# Patient Record
Sex: Male | Born: 2005 | Race: White | Hispanic: No | Marital: Single | Smoking: Never smoker
Health system: Southern US, Community
[De-identification: ages and names within clinical notes are randomized; demographics above are authoritative.]

## PROBLEM LIST (undated history)

## (undated) DIAGNOSIS — L309 Dermatitis, unspecified: Secondary | ICD-10-CM

## (undated) DIAGNOSIS — K219 Gastro-esophageal reflux disease without esophagitis: Secondary | ICD-10-CM

## (undated) DIAGNOSIS — K5010 Crohn's disease of large intestine without complications: Secondary | ICD-10-CM

## (undated) DIAGNOSIS — E559 Vitamin D deficiency, unspecified: Secondary | ICD-10-CM

## (undated) DIAGNOSIS — E46 Unspecified protein-calorie malnutrition: Secondary | ICD-10-CM

## (undated) DIAGNOSIS — D649 Anemia, unspecified: Secondary | ICD-10-CM

## (undated) HISTORY — DX: Anemia, unspecified: D64.9

## (undated) HISTORY — DX: Dermatitis, unspecified: L30.9

## (undated) HISTORY — DX: Gastro-esophageal reflux disease without esophagitis: K21.9

## (undated) HISTORY — PX: WISDOM TOOTH EXTRACTION: SHX21

## (undated) HISTORY — PX: BOWEL RESECTION: SHX1257

## (undated) HISTORY — DX: Vitamin D deficiency, unspecified: E55.9

## (undated) HISTORY — DX: Unspecified protein-calorie malnutrition: E46

---
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
## 2023-05-28 ENCOUNTER — Telehealth: Payer: Self-pay | Admitting: Pediatrics

## 2023-05-28 NOTE — Telephone Encounter (Signed)
Inbound call fro patient father in regards to labs patient had done previously. Patient is schedule for apt on 3/14 with provider. Requesting to speak with a nurse in regards to patient. Please advise.

## 2023-05-28 NOTE — Telephone Encounter (Signed)
Left voicemail on patients mobile for patient to call back. Patient's father called. Unfortunately, we do not have an ROI on file for this patient and he is now 18 years old. Therefore, I will need to speak with only him until we get a signed ROI giving clearance to speak with another family member.

## 2023-06-01 NOTE — Telephone Encounter (Signed)
Talked to patient. He is unaware of any phone calls that his father would have made to our office. States that he is supposed to have labwork and orders are in the computer but he will not be able to go until Tuesday. Advised patient that he may call us back if he speaks with his father and finds out something is needed from [place]. However, I can only speak to patient currently until we get a written ROI to speak with anyone else. He verbalizes understanding.

## 2023-07-23 NOTE — Progress Notes (Unsigned)
 [facility] Return Visit   Referring Provider No referring provider defined for this encounter.  Primary Care Provider Pudlo, Gennie Alma, MD (Inactive)  Patient Profile: Hector Dominguez is a 18 y.o. male who returns to the [facility] for follow-up of the problem(s) noted below.  Problem List: Stricturing ileocolonic Crohn's disease diagnosed 10/2017 status post ileocecectomy 12/2021 complicated by abdominal wall abscess, intra-abdominal fluid collections, bowel obstructions with diverting loop ileostomy 05/2022 Weight loss Severe protein calorie malnutrition with previous need for TPN Anemia Vitamin D deficiency   History of Present Illness   Hector Dominguez was last seen by me at [facility] 03/10/2023   Current GI Meds  Rinvoq 30 mg po daily - Induction 12/2022 Colestid 2 g p.o. twice daily   Interval History    Last colonoscopy: 11/2021 -colon appeared mostly normal. At this ulceration seen. Narrowing noted at the ascending colon and unable to traverse.  Last endoscopy: 11/2021 - normal   Last Abd CT/CTE/MRE: 12/2022 -CTAP -partial SBO with transition point in the left mid abdomen had a small bowel-small bowel anastomosis.  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 Otherwise negative.  General Review of Systems  Review of systems is significant for the pertinent positives and negatives as listed per the HPI.  Full ROS is otherwise negative.  Inflammatory Bowel Disease History  10/2017 - abdominal pain, diarrhea, weight loss; EGD/Colonoscopy -gastroduodenal and ileocolonic Crohn's disease --> treated with prednisone taper and Humira 40 mg Q 14 days with response 03/2018 - prednisone taper and Humira increased to q. 7 days for flare symptoms, ADA 14 with low level antibodies --> oral MTX added 08/2020 - active sx; EGD/Colonoscopy -pancolitis and  ileitis 11/2020 - Induction Stelara with symptomatic response 06/2021 - Stelara increased from every 8 weeks to every 6 weeks for CRP 60, fecal calprotectin 732 and required prednisone taper 11/2021 - EGD/Colonoscopy - stricture in ileocecal region/ascending colon but otherwise relatively normal-appearing colon 12/2021 - laparoscopic ileocecectomy with side-to-side functional end-to-end anastomosis c/b abdominal wall and intra-abdominal fluid collections Fall 2023 - ~ 5 hospitalizations including abdominal exploration/washout 02/25/2022 and 04/30/2022; E faecalis bacteremia 04/15/2022 on TPN with central line 05/2022 - Ex lap for high-grade bowel obstruction secondary to adhesions and diverting ileostomy 11/2022 - Ileostomy takedown and went back to the OR same hospitalization for resection of distal obstruction at site of previous anastomosis 01/2023 - On steroid taper for abd pain (pain responds to steroids); Stelara D/C'd for lack of response and started Rinvoq   IBD Medication History Humira - partial clinical response with report of low level of immunogenicity/antibodies Methotrexate - added to Humira for reduction of immunogenicity Stelara - trialed status post Humira for lack of response -initially demonstrated a response but subsequently began having active symptoms and inflammation despite surgery Rinvoq - initiated 01/2023 for lack of response to Stelara  Past Medical History   Past Medical History:  Diagnosis Date   Crohn's colitis [facility])      Past Surgical History  No past surgical history on file.   Allergies and Medications  Not on File  @MEDSTODAY @  Family History  No family history on file. GI Specific Family History: {gifamhx:50061}   Social History   Social History   Tobacco Use   Smoking status: Never   Hector Dominguez reports that he has never smoked. He does not have any smokeless tobacco history on file. No history on file for alcohol use  and drug use.  Vital  Signs and Physical Examination  There were no vitals filed for this visit. There is no height or weight on file to calculate BMI.    General: Well developed, well nourished, no acute distress Head: Normocephalic and atraumatic Eyes: Sclerae anicteric, EOMI Ears: Normal auditory acuity Mouth: No deformities or lesions noted Lungs: Clear throughout to auscultation Heart: Regular rate and rhythm; No murmurs, rubs or bruits Abdomen: Soft, non tender and non distended. No masses, hepatosplenomegaly or hernias noted. Normal Bowel sounds Rectal: Musculoskeletal: Symmetrical with no gross deformities  Pulses:  Normal pulses noted Extremities: No edema or deformities noted Neurological: Alert oriented x 4, grossly nonfocal Psychological:  Alert and cooperative. Normal mood and affect   Review of Data  The following data was reviewed at the time of this encounter:  Laboratory Studies      Latest Ref Rng & Units 05/19/2014   10:30 AM  CBC  WBC 4.5 - 13.5 K/uL 7.0   Hemoglobin 11.0 - 14.6 g/dL 01.0   Hematocrit 27.2 - 44.0 % 38.2   Platelets 150 - 400 K/uL 216     Lab Results  Component Value Date   LIPASE 26 05/19/2014      Latest Ref Rng & Units 05/19/2014   10:30 AM  CMP  Glucose 70 - 99 mg/dL 536   BUN 6 - 23 mg/dL 8   Creatinine 6.44 - 0.34 mg/dL 7.42   Sodium 595 - 638 mmol/L 137   Potassium 3.5 - 5.1 mmol/L 3.8   Chloride 96 - 112 mEq/L 103   CO2 19 - 32 mmol/L 28   Calcium 8.4 - 10.5 mg/dL 9.1   Total Protein 6.0 - 8.3 g/dL 6.4   Total Bilirubin 0.3 - 1.2 mg/dL 0.5   Alkaline Phos 42 - 362 U/L 170   AST 0 - 37 U/L 23   ALT 0 - 53 U/L 12    IBD Labs  Prebiologic Labs @LASTLAB [facility]  Therapeutic Drug Monitoring @LASTLAB (TPMTACT)@ Thiopurine metabolite levels:  Date:                6-TGN***       6-MMP***  Biologic level and antibodies:***  Fecal Calprotectin 12/2022     587 05/2023   2320 07/2023    1660  Imaging Studies  CTAP 12/22/2022 1. Partial small bowel obstruction with transition point in the left mid abdomen at a small bowel-small bowel anastomosis. No pneumatosis or pneumoperitoneum. 2. 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 Correlate with direct inspection.   CTAP 11/21/22 1. Diffusely dilated small bowel. It is unclear if this represents severe postoperative ileus versus obstruction. If obstruction, candidate transition point may be at the site of post surgical change in the right lower quadrant versus a transition in left upper quadrant, as described above. Recommend attention on follow-up imaging. 2. Moderate volume free air and fluid may be postoperative in etiology. This may also be reevaluated on follow-up imaging.  CTAP 06/24/2022 Postsurgical changes of ileocecectomy. Interval decrease in previously seen inflammatory changes around the proximal colon. Interval creation of right lower quadrant end ileostomy. Left upper quadrant small bowel anastomosis. Left upper quadrant small bowel anastomosis. Mild hyperenhancement of a short segment of distal ileum at the ileostomy which could reflect active inflammation. Interval resolution of previously seen left lower quadrant intra-abdominal abscess. No new abscess is seen.  Soft tissue thickening extending from the left lateral margin of the anus to the  perineal skin (for example series 4 image 160. This was partially outside the imaged area on 03/26/2022 and could represent a perianal fistula of indeterminate patency. This would be better characterized by MRI.  MRE 05/13/2022 1. Post-surgical changes of ileoceectomy with similar inflammatory changes about the neoterminal ileum, ascending colon, and hepatic flexure, given differences in modality. 2. Increased distention of small bowel and fecalization of contents, with 2 transition points within the right abdomen adjacent  to the inflammatory changes within the right lower quadrant. However, there is also moderate volume stool in the distal large bowel, suggesting early or partial obstruction. 3. New small rim-enhancing collection anterior to the right iliopsoas, likely abscess. 4. Stable left midline intraperitoneal abscess with close abutment to adjacent bowel. Fistulous connection is not well evaluated on this exam.   CTAP 04/14/2022 1. Postsurgical changes of ileocecectomy with similar to minimally improved inflammatory changes about the neoterminal ileum, ascending colon, hepatic flexure. 2. Increased circumferential bowel wall thickening of the descending colon, sigmoid colon, and rectum that may reflect a nonspecific colitis.  3. Continued slight decrease in the anterior left-midline intraperitoneal abscess that now has a small focus of air, but without internal contrast. There is still close abutment to adjacent small bowel without frank fistulous connection visualized on the current study. 4. Unchanged partially visualized asymmetric left gluteal thickening possibly reflecting sequelae from prior perianal fistula.  CTE 03/26/2022 1. Postsurgical changes of ileocecectomy similar inflammatory changes within the right lower quadrant. 2. Decreased size of left anterior intraperitoneal abscess, with persistent possible extension to the bowel in the right lower quadrant.   CTAP 03/12/2022 1. Post surgical changes of ileocecectomy. Similar inflammatory changes within the neoterminal ileum, ascending colon, and hepatic flexure. 2. Intraperitoneal abscess which drains through the umbilicus with possible tract/communication with the neoileum in the left lower quadrant, though no evidence of oral contrast within this tract/abscess. 3. Similar asymmetric soft tissue thickening of the left gluteal cleft and tiny left rectal wall fluid collection, possibly perianal fistula.   GI Procedures and Studies  EGD/Colonoscopy  11/2021 EGD - normal Colonoscopy -mostly normal colon with some scattered aphthae, narrowing at the ascending colon, unable to traverse Path: Duodenal mucosa with villous blunting and increased intraepithelial lymphocytes, chronic inactive colitis in the ascending colon, sigmoid colon; normal mucosa in transverse colon and rectum  EGD/Colonoscopy 08/2020 EGD -mild inflammatory changes seen in the stomach and duodenum Colonoscopy -anal fissure, diffuse severe inflammation throughout the entire colon, scattered inflammatory changes in the terminal ileum Path: Duodenal mucosa with villous blunting and increased intraepithelial lymphocytes, no active enteritis, moderate chronic and active gastritis, focal active ileitis, mild chronic active colitis throughout the entire colon  EGD/Colonoscopy 10/2017 EGD -ulcerated mucosa in the esophagus and prepyloric stomach Colonoscopy -diffuse moderate inflammation from the rectum to the ileocecal valve Path: Mixed acute and chronic inflammation in the esophagus, chronic active gastritis, chronic active duodenitis, TI biopsies with focal crypt architectural distortion, active colitis in the rectum, left colon and right colon; transverse colon biopsies normal    Clinical Impression  It is my clinical impression that Hector Dominguez is a 18 y.o. male with;  Stricturing ileocolonic Crohn's disease diagnosed 10/2017 status post ileocecectomy 12/2021 complicated by abdominal wall abscess, intra-abdominal fluid collections, bowel obstructions with diverting loop ileostomy 05/2022 Weight loss Severe protein calorie malnutrition with previous need for TPN Anemia Vitamin D deficiency  Hector Dominguez was diagnosed with Crohn's disease 10/2017 after presenting with symptoms of abdominal pain, diarrhea and weight loss. His distribution of  disease has included the stomach, duodenum, ileum and colon. At the time of diagnosis his phenotype was strictly inflammatory and later progressed  to stricturing disease.  From a medical management standpoint, he was initially treated with prednisone taper and Humira optimized from q. 14-day dosing to Q7-day dosing due to flare activity and low level antibodies. Methotrexate was also added to his regimen. Unfortunately this did not control his disease and he was therefore transitioned to [facility] 11/2020. He reported a symptomatic response but continued to manifest elevated CRP and fecal calprotectin. Stelara was further dose optimized to every 4 weeks. Colonoscopy in summer 2023 demonstrated a relatively normal-appearing colon with a few scattered apathae but evidence of a stricture in the ascending colon/ileocecal region.   Hector Dominguez underwent laparoscopic ileocecectomy 12/2021 complicated by abdominal wall abscess, intra-abdominal fluid collections, bowel obstructions with diverting loop ileostomy 05/2022 for which he has had multiple hospitalizations, re-operation and required TPN. In 11/2022 he underwent ileostomy takedown. During that same hospitalization he required reoperation for distal obstruction at site of previous anastomosis.   Hector Dominguez was rehospitalized 12/2022 for abdominal pain. CTAP showed partial small bowel obstruction with a transition point in a small bowel-small bowel anastomosis. 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 His wound has been managed by pediatric surgery. Due to ongoing symptoms of pain and inflammation he was initiated on a steroid taper to which he responded. In 01/2023 Stelara was discontinued and he was started on Rinvoq 45 mg po daily.    Plan  *** *** *** *** ***   Planned Follow Up No follow-ups on file.  The patient or caregiver verbalized understanding of the material covered, with no barriers to understanding. All questions were answered. Patient or caregiver is agreeable with the plan outlined above.    It was a pleasure to see Hector Dominguez.  If you have any questions or concerns regarding this  evaluation, do not hesitate to contact me.  Maren Beach, MD [facility]

## 2023-07-24 ENCOUNTER — Ambulatory Visit: Payer: Managed Care, Other (non HMO) | Admitting: Pediatrics

## 2023-07-24 ENCOUNTER — Encounter: Payer: Self-pay | Admitting: Pediatrics

## 2023-07-24 ENCOUNTER — Other Ambulatory Visit

## 2023-07-24 VITALS — BP 90/60 | HR 85 | Ht 69.0 in | Wt 132.0 lb

## 2023-07-24 DIAGNOSIS — D649 Anemia, unspecified: Secondary | ICD-10-CM

## 2023-07-24 DIAGNOSIS — K50814 Crohn's disease of both small and large intestine with abscess: Secondary | ICD-10-CM

## 2023-07-24 DIAGNOSIS — R634 Abnormal weight loss: Secondary | ICD-10-CM

## 2023-07-24 DIAGNOSIS — Z7960 Long term (current) use of unspecified immunomodulators and immunosuppressants: Secondary | ICD-10-CM

## 2023-07-24 DIAGNOSIS — E538 Deficiency of other specified B group vitamins: Secondary | ICD-10-CM

## 2023-07-24 DIAGNOSIS — K56699 Other intestinal obstruction unspecified as to partial versus complete obstruction: Secondary | ICD-10-CM

## 2023-07-24 LAB — IBC PANEL
Iron: 83 ug/dL (ref 42–165)
Saturation Ratios: 23.6 % (ref 20.0–50.0)
TIBC: 351.4 ug/dL (ref 250.0–450.0)
Transferrin: 251 mg/dL (ref 212.0–360.0)

## 2023-07-24 LAB — FERRITIN: Ferritin: 10.3 ng/mL — ABNORMAL LOW (ref 22.0–322.0)

## 2023-07-24 LAB — B12 AND FOLATE PANEL
Folate: 9.2 ng/mL (ref >5.9)
Vitamin B-12: 265 pg/mL (ref 211–911)

## 2023-07-24 LAB — VITAMIN D 25 HYDROXY (VIT D DEFICIENCY, FRACTURES): VITD: 14.44 ng/mL — ABNORMAL LOW (ref 30.00–100.00)

## 2023-07-24 NOTE — Patient Instructions (Addendum)
 Your provider has requested that you go to the basement level for lab work before leaving today. Press "B" on the elevator. The lab is located at the first door on the left as you exit the elevator.   Due to recent changes in healthcare laws, you may see the results of your imaging and laboratory studies on MyChart before your provider has had a chance to review them.  We understand that in some cases there may be results that are confusing or concerning to you. Not all laboratory results come back in the same time frame and the provider may be waiting for multiple results in order to interpret others.  Please give [place] 48 hours in order for your provider to thoroughly review all the results before contacting the office for clarification of your results.   It has been recommended to you by your physician that you have a(n) MR Enterography completed. Per your request, we did not schedule the procedure(s) today. Please contact our office at [phone] should you decide to have the procedure completed.    Thank you for entrusting me with your care and for choosing [facility], Dr. Maren Beach  _______________________________________________________  If your blood pressure at your visit was 140/90 or greater, please contact your primary care physician to follow up on this.  _______________________________________________________  If you are age 10 or older, your body mass index should be between 23-30. Your Body mass index is 19.49 kg/m. If this is out of the aforementioned range listed, please consider follow up with your Primary Care Provider.  If you are age 65 or younger, your body mass index should be between 19-25. Your Body mass index is 19.49 kg/m. If this is out of the aformentioned range listed, please consider follow up with your Primary Care Provider.   ________________________________________________________  The  GI providers would like to encourage you to use  [facility] to communicate with providers for non-urgent requests or questions.  Due to long hold times on the telephone, sending your provider a message by [facility] may be a faster and more efficient way to get a response.  Please allow 48 business hours for a response.  Please remember that this is for non-urgent requests.  _______________________________________________________

## 2023-07-28 ENCOUNTER — Other Ambulatory Visit: Payer: Self-pay

## 2023-07-28 MED ORDER — VITAMIN D (ERGOCALCIFEROL) 1.25 MG (50000 UNIT) PO CAPS: 50000.0000 [IU] | ORAL_CAPSULE | ORAL | 0 refills | Status: DC

## 2023-08-04 ENCOUNTER — Telehealth: Payer: Self-pay | Admitting: Pediatrics

## 2023-08-04 MED ORDER — RINVOQ 30 MG PO TB24: 1.0000 | ORAL_TABLET | Freq: Every day | ORAL | 3 refills | Status: DC

## 2023-08-04 NOTE — Telephone Encounter (Signed)
 We can see PA in Care Everywhere. Rinvoq 30 mg tablet refill sent to [facility].

## 2023-08-04 NOTE — Telephone Encounter (Signed)
 Inbound call from [facility] in [place] advising they received a refill request for patient's Rinvoq. Stated they tried to fax over request to our office but unable to. Please advise, thank you.

## 2023-08-13 ENCOUNTER — Telehealth: Payer: Self-pay

## 2023-08-13 NOTE — Telephone Encounter (Signed)
 Received a call from patient's father to review recommended supplements again.   I reviewed that patient should take vitamin D 50,000 international units once a week x 8 weeks.  Once he has completed weekly high-dose vitamin D he should take vitamin D 2000 international units daily, ferrous sulfate 325 mg daily, vitamin B12 supplement 1000 mcg orally daily. Patient's father verbalized understanding and had no concerns at the end of the call.

## 2023-08-17 ENCOUNTER — Telehealth: Payer: Self-pay

## 2023-08-17 ENCOUNTER — Other Ambulatory Visit (HOSPITAL_COMMUNITY): Payer: Self-pay

## 2023-08-17 MED ORDER — RINVOQ 30 MG PO TB24: 1.0000 | ORAL_TABLET | Freq: Every day | ORAL | 3 refills | Status: AC

## 2023-08-17 NOTE — Telephone Encounter (Signed)
 Refill request received from [facility] for Rinvoq 30mg .  Patient was last seen on 07/24/23.  Refill sent.

## 2023-08-17 NOTE — Telephone Encounter (Signed)
 PA request has been Submitted. New Encounter has been or will be created for follow up. For additional info see Pharmacy Prior Auth telephone encounter from 08-17-2023.

## 2023-08-17 NOTE — Telephone Encounter (Signed)
 Pharmacy Patient Advocate Encounter   Received notification from Pt Calls Messages that prior authorization for Rinvoq 30MG  er tablets is required/requested.   Insurance verification completed.   The patient is insured through [employer] .   Per test claim: PA required; PA submitted to above mentioned insurance via CoverMyMeds Key/confirmation #/EOC [facility] Status is pending

## 2023-08-17 NOTE — Telephone Encounter (Signed)
 Refill sent to [facility] [phone]) today requires a PA.  [facility] acct# [id number]

## 2023-08-19 ENCOUNTER — Other Ambulatory Visit (HOSPITAL_COMMUNITY): Payer: Self-pay

## 2023-08-19 NOTE — Telephone Encounter (Signed)
Pharmacy Patient Advocate Encounter  Additional information has been requested from the patient's insurance in order to proceed with the prior authorization request. Requested information has been sent, or form has been filled out and faxed back to [phone]

## 2023-08-27 ENCOUNTER — Telehealth: Payer: Self-pay

## 2023-08-27 NOTE — Telephone Encounter (Signed)
 I called the pharmacy; [facility]. A prescription for Rinvoq 30 mg was sent to [facility] on 08/17/23.

## 2023-09-02 ENCOUNTER — Other Ambulatory Visit (HOSPITAL_COMMUNITY): Payer: Self-pay

## 2023-09-02 NOTE — Telephone Encounter (Signed)
 Pharmacy Patient Advocate Encounter  Received notification from [employer] that Prior Authorization for  Rinvoq  30MG  er tablets has been APPROVED from 08-30-2023 to 11-11-2023.  Insurance pays a maximum of 30 day supply. Must fill at specialty pharmacy.  PA #/Case ID/Reference #: Lorenza [id number]

## 2023-10-23 ENCOUNTER — Telehealth: Payer: Self-pay | Admitting: Pediatrics

## 2023-10-23 MED ORDER — DEXAMETHASONE 0.5 MG/5ML PO ELIX: ORAL_SOLUTION | ORAL | 0 refills | Status: DC

## 2023-10-23 NOTE — Telephone Encounter (Signed)
 Called and spoke with patient regarding recommendations. Patient states that he will be leaving for [place]  in the morning for a week long trip, patient has requested that RX be sent to [facility] in [place] . Patient had no concerns at the end of the call.

## 2023-10-23 NOTE — Addendum Note (Signed)
 Addended by: Frankie Zito N on: 10/23/2023 04:25 PM   Modules accepted: Orders

## 2023-10-23 NOTE — Telephone Encounter (Signed)
 Dr. Venice Gillis as DOD this afternoon please see note from patient regarding mouth ulcers. Dr. Jarold Merlin patient.   Problem List: Stricturing ileocolonic Crohn's disease diagnosed 10/2017 status post ileocecectomy 12/2021 complicated by abdominal wall abscess, intra-abdominal fluid collections, bowel obstructions with diverting loop ileostomy 05/2022 Weight loss - improving History of severe protein calorie malnutrition with previous need for TPN Anemia Vitamin D  deficiency

## 2023-10-23 NOTE — Telephone Encounter (Signed)
 Lets do Dexamethsone elixir (0.5mg /5 ml) 5 ml swish and split  3 times a day x 7 days Keep the soln x 5 min each time and don't eat/drink x 30 min thereafter Let us  know how she is in 7 days RG

## 2023-10-23 NOTE — Telephone Encounter (Signed)
 Inbound call from patient stating he has been having sensitstivity in his mouth when eating. States it has been getting worse and starting to develop ulcers. Patient states this has been going on for the past few weeks. Requesting a call back to discuss further. Please advise, thank you.

## 2023-11-24 ENCOUNTER — Other Ambulatory Visit (HOSPITAL_COMMUNITY): Payer: Self-pay

## 2023-11-24 ENCOUNTER — Telehealth: Payer: Self-pay

## 2023-11-24 NOTE — Telephone Encounter (Signed)
 Pharmacy Patient Advocate Encounter   Received notification from CoverMyMeds that prior authorization for Rinvoq  30MG  er tablets is required/requested.   Insurance verification completed.   The patient is insured through [employer] .   Per test claim: PA required; PA submitted to above mentioned insurance via CoverMyMeds Key/confirmation #/EOC [id number] Status is pending

## 2023-12-01 ENCOUNTER — Other Ambulatory Visit (HOSPITAL_COMMUNITY): Payer: Self-pay

## 2023-12-01 NOTE — Telephone Encounter (Signed)
 Pharmacy Patient Advocate Encounter  Contacted insurance to check on status and was told they have an open case but they have not yet received the additional information they requested. I had faxed this out originally on 11-25-2023 after receiving the form. I will resubmit it now.

## 2023-12-01 NOTE — Telephone Encounter (Signed)
 Pharmacy Patient Advocate Encounter  Insurance requires clinical documentation on patient progress (office visit) from AFTER 08-19-2023.  Must show in documentation:  Has the patient has achieved or maintained remission OR achieved or maintained a positive clinical response as evidenced by low disease activity or improvement in signs and symptoms of the condition since starting treatment with the requested drug?   Which of the following has the patient experienced an improvement in from baseline:? Abdominal pain or tenderness Diarrhea Body weight Abdominal mass Hematocrit Appearance of the mucosa on endoscopy, computed tomography enterography (CTE), magnetic resonance enterography (MRE), or intestinal ultrasound Improvement on a disease activity scoring tool (e.g. Crohn's Disease Activity Index [CDAI] score)

## 2023-12-01 NOTE — Telephone Encounter (Signed)
 Called and spoke with patient. Patient has been scheduled for a follow up with Dr. Suzann Thursday, 12/03/23 at 3:40 pm.

## 2023-12-02 NOTE — Progress Notes (Addendum)
 [facility] Return Visit   Referring Provider No referring provider defined for this encounter.  Primary Care Provider Pudlo, Tanda PARAS, MD (Inactive)  Patient Profile: Hector Dominguez is a 18 y.o. male who returns to the [facility] for follow-up of the problem(s) noted below.  Problem List: Stricturing ileocolonic Crohn's disease diagnosed 10/2017 status post ileocecectomy 12/2021 complicated by abdominal wall abscess, intra-abdominal fluid collections, bowel obstructions with diverting loop ileostomy 05/2022 Weight loss - improving History of severe protein calorie malnutrition with previous need for TPN Anemia/low iron Vitamin D  deficiency Oral ulcers   History of Present Illness   Hector Dominguez was last seen by me at [facility] 03/10/2023   Current GI Meds  Rinvoq  30 mg po daily - Induction 12/2022 Colestid 2 g p.o. twice daily   Interval History  -- Hector Dominguez presents to the office today accompanied by his mother  -- States that overall he feels that he is doing well -- Denies abdominal pain  -- Having 1 bowel movement a day that tends to be loose in nature -- No melena or hematochezia -- Has some gas but denies significant bloating -- No upper GI tract symptoms of GERD, nausea, vomiting, dysphagia or odynophagia  -- Denies extraintestinal manifestations of fevers, chills, joint pain, skin rashes. 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 At this ulceration seen. Narrowing noted at the ascending colon and unable to traverse.  Last endoscopy: 11/2021 - normal   Last Abd CT/CTE/MRE: 12/2022 -CTAP -partial SBO with transition point in the left mid abdomen had a small bowel-small bowel anastomosis.  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 Otherwise negative.  General Review of Systems  Review of systems is significant for the pertinent positives and negatives as listed per the HPI.  Full ROS is otherwise negative.  Inflammatory Bowel Disease History  10/2017 - abdominal pain, diarrhea, weight loss; EGD/Colonoscopy -gastroduodenal and ileocolonic Crohn's disease --> treated with prednisone  taper and Humira 40 mg Q 14 days with response 03/2018 - prednisone  taper and Humira increased to q. 7 days for flare symptoms, ADA 14 with low level antibodies --> oral MTX added 08/2020 - active sx; EGD/Colonoscopy -pancolitis and ileitis 11/2020 - Induction Stelara with symptomatic response 06/2021 - Stelara increased from every 8 weeks to every 6 weeks for CRP 60, fecal calprotectin 732 and required prednisone  taper 11/2021 - EGD/Colonoscopy - stricture in ileocecal region/ascending colon but otherwise relatively  normal-appearing colon 12/2021 - laparoscopic ileocecectomy with side-to-side functional end-to-end anastomosis c/b abdominal wall and intra-abdominal fluid  collections Fall 2023 - ~ 5 hospitalizations including abdominal exploration/washout 02/25/2022 and 04/30/2022; E faecalis bacteremia 04/15/2022 on TPN with central line 05/2022 - Ex lap for high-grade bowel obstruction secondary to adhesions and diverting ileostomy 11/2022 - Ileostomy takedown and went back to the OR same hospitalization for resection of distal obstruction at site of previous anastomosis 01/2023 - On steroid taper for abd pain (pain responds to steroids); Stelara D/C'd for lack of response and started Rinvoq    IBD Medication History Humira - partial clinical response with report of low level of immunogenicity/antibodies Methotrexate - added to Humira for reduction of immunogenicity Stelara - trialed status post Humira for lack of response -initially demonstrated a response but subsequently began having active symptoms and inflammation despite surgery Rinvoq  - initiated 01/2023 for lack of response to Stelara  Past Medical History   Past Medical History:  Diagnosis Date   Anemia    Crohn's colitis (HCC)    Eczema    GERD (gastroesophageal reflux disease)    Protein calorie malnutrition (HCC)    Vitamin D  deficiency      Past Surgical History   Past Surgical History:  Procedure Laterality Date   BOWEL RESECTION     WISDOM TOOTH EXTRACTION     MYRINGOTOMY & TUBES 2006  LAPAROSCOPIC COLECTOMY PARTIAL W/ANASTAMOSIS N/A 01/09/2022  CYSTOURETHROSCOPY W/URETERAL CATHIZATION W/WO RETROGRADE PYELOGRAM Bilateral 01/09/2022  INCISION & DRAINAGE ABSCESS ABDOMEN N/A 02/25/2022  INCISION & DRAINAGE POSTOP WOUND INFECTION ABDOMEN N/A 04/30/2022  LAPAROSCOPY DIAGNOSTIC N/A 05/15/2022  CYSTOURETHROSCOPY W/URETERAL CATHIZATION W/WO RETROGRADE PYELOGRAM Bilateral 05/15/2022  URETEROGRAM RETROGRADE W/WO KUB Bilateral 05/15/2022  CLOSURE ENTEROSTOMY LARGE/SMALL INTESTINE N/A 11/19/2022  EXPLORATORY LAPAROTOMY N/A 11/24/2022  RESECTION SMALL BOWEL N/A 11/24/2022  CYSTOURETHROSCOPY  W/INSERTION/EXCHANGE URETERAL STENT Bilateral 11/24/2022  DEBRIDEMENT ABDOMEN N/A 12/23/2022   Allergies and Medications  No Known Allergies   Rinvoq  30 mg orally daily  Family History   Family History  Problem Relation Age of Onset   Colon cancer Neg Hx    Esophageal cancer Neg Hx    Stomach cancer Neg Hx      Social History   Social History   Tobacco Use   Smoking status: Never  Substance Use Topics   Alcohol use: Never   Jaquann reports that he has never smoked. He does not have any smokeless tobacco history on file. He reports that he does not drink alcohol. No history on file for drug use.  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 Weight: 124 lb (56.2 kg)  General: Pale,thin, no acute distress Head: Normocephalic and atraumatic Eyes: Sclerae anicteric, EOMI Lungs: Clear throughout to auscultation Heart: Regular rate and rhythm; No murmurs, rubs or bruits Abdomen: Soft, non tender and non distended. No masses, hepatosplenomegaly or hernias noted. Normal Bowel sounds, multiple well-healed abdominal scars Rectal: Deferred Musculoskeletal: Symmetrical with no gross deformities     Review of Data  The following data was reviewed at the time of this encounter:  Laboratory Studies      Latest Ref Rng &  Units 12/03/2023    4:53 PM 05/19/2014   10:30 AM  CBC  WBC 4.5 - 10.5 K/uL 6.6  7.0   Hemoglobin 13.0 - 17.0 g/dL 86.1  86.4   Hematocrit 39.0 - 52.0 % 41.4  38.2   Platelets 150.0 - 400.0 K/uL 344.0  216     Lab Results  Component Value Date   LIPASE 26 05/19/2014      Latest Ref Rng & Units 12/03/2023    4:53 PM 05/19/2014   10:30 AM  CMP  Glucose 70  - 99 mg/dL 75  891   BUN 6 - 23 mg/dL 14  8   Creatinine 9.59 - 1.50 mg/dL 9.02  9.44   Sodium 864 - 145 mEq/L 143  137   Potassium 3.5 - 5.1 mEq/L 4.5  3.8   Chloride 96 - 112 mEq/L 106  103   CO2 19 - 32 mEq/L 32  28   Calcium 8.4 - 10.5 mg/dL 9.2  9.1   Total Protein 6.0 - 8.3 g/dL 7.3  6.4   Total Bilirubin 0.2 - 1.2 mg/dL 0.5  0.5   Alkaline Phos 39 - 117 U/L 75  170   AST 0 - 37 U/L 10  23   ALT 0 - 53 U/L 9  12    Lab Results  Component Value Date   IRON 31 (L) 12/03/2023   TIBC 333.2 12/03/2023   FERRITIN 15.9 (L) 12/03/2023   Vitamin D  22.45 Vitamin B12 575 Folate 6.7   IBD Labs  Prebiologic Labs 2019 Hepatitis B surface antigen negative Hepatitis B surface antibody negative Hepatitis B core antibody total negative QuantiFERON gold negative  Therapeutic Drug Monitoring  Thiopurine metabolite levels:  Date:                6-TGN       6-MMP  Biologic level and antibodies:  Fecal Calprotectin 12/2022     587 05/2023   2320 07/2023   1660  Imaging Studies  CTAP 12/22/2022 1. Partial small bowel obstruction with transition point in the left mid abdomen at a small bowel-small bowel anastomosis. No pneumatosis or pneumoperitoneum. 2. 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 Correlate with direct inspection.   CTAP 11/21/22 1. Diffusely dilated small bowel. It is unclear if this represents severe postoperative ileus versus obstruction. If obstruction, candidate transition point may be at the site of post surgical change in the right lower quadrant versus a transition in left upper quadrant, as described above. Recommend attention on follow-up imaging. 2. Moderate volume free air and fluid may be postoperative in etiology. This may also be reevaluated on follow-up imaging.  CTAP 06/24/2022 Postsurgical changes of ileocecectomy. Interval decrease in previously seen inflammatory changes around the proximal colon.  Interval creation of right lower quadrant end ileostomy. Left upper quadrant small bowel anastomosis. Left upper quadrant small bowel anastomosis. Mild hyperenhancement of a short segment of distal ileum at the ileostomy which could reflect active inflammation. Interval resolution of previously seen left lower quadrant intra-abdominal abscess. No new abscess is seen.  Soft tissue thickening extending from the left lateral margin of the anus to the perineal skin (for example series 4 image 160. This was partially outside the imaged area on 03/26/2022 and could represent a perianal fistula of indeterminate patency. This would be better characterized by MRI.  MRE 05/13/2022 1. Post-surgical changes of ileoceectomy with similar inflammatory changes about the neoterminal ileum, ascending colon, and hepatic  flexure, given differences in modality. 2. Increased distention of small bowel and fecalization of contents, with 2 transition points within the right abdomen adjacent to the inflammatory changes within the right lower quadrant. However, there is also moderate volume stool in the distal large bowel, suggesting early or partial obstruction. 3. New small rim-enhancing collection anterior to the right iliopsoas, likely abscess. 4. Stable left midline intraperitoneal abscess with close abutment to adjacent bowel. Fistulous connection is not well evaluated on this exam.   CTAP 04/14/2022 1. Postsurgical changes of ileocecectomy with similar to minimally improved inflammatory changes about the neoterminal ileum, ascending colon, hepatic flexure. 2. Increased circumferential bowel wall thickening of the descending colon, sigmoid colon, and rectum that may reflect a nonspecific colitis.  3. Continued slight decrease in the anterior left-midline intraperitoneal abscess that now has a small focus of air, but without internal contrast. There is still close abutment to adjacent small bowel without frank  fistulous connection visualized on the current study. 4. Unchanged partially visualized asymmetric left gluteal thickening possibly reflecting sequelae from prior perianal fistula.  CTE 03/26/2022 1. Postsurgical changes of ileocecectomy similar inflammatory changes within the right lower quadrant. 2. Decreased size of left anterior intraperitoneal abscess, with persistent possible extension to the bowel in the right lower quadrant.   CTAP 03/12/2022 1. Post surgical changes of ileocecectomy. Similar inflammatory changes within the neoterminal ileum, ascending colon, and hepatic flexure. 2. Intraperitoneal abscess which drains through the umbilicus with possible tract/communication with the neoileum in the left lower quadrant, though no evidence of oral contrast within this tract/abscess. 3. Similar asymmetric soft tissue thickening of the left gluteal cleft and tiny left rectal wall fluid collection, possibly perianal fistula.   GI Procedures and Studies  EGD/Colonoscopy 11/2021 EGD - normal Colonoscopy -mostly normal colon with some scattered aphthae, narrowing at the ascending colon, unable to traverse Path: Duodenal mucosa with villous blunting and increased intraepithelial lymphocytes, chronic inactive colitis in the ascending colon, sigmoid colon; normal mucosa in transverse colon and rectum  EGD/Colonoscopy 08/2020 EGD -mild inflammatory changes seen in the stomach and duodenum Colonoscopy -anal fissure, diffuse severe inflammation throughout the entire colon, scattered inflammatory changes in the terminal ileum Path: Duodenal mucosa with villous blunting and increased intraepithelial lymphocytes, no active enteritis, moderate chronic and active gastritis, focal active ileitis, mild chronic active colitis throughout the entire colon  EGD/Colonoscopy 10/2017 EGD -ulcerated mucosa in the esophagus and prepyloric stomach Colonoscopy -diffuse moderate inflammation from the rectum to the  ileocecal valve Path: Mixed acute and chronic inflammation in the esophagus, chronic active gastritis, chronic active duodenitis, TI biopsies with focal crypt architectural distortion, active colitis in the rectum, left colon and right colon; transverse colon biopsies normal    Clinical Impression  It is my clinical impression that Hector Dominguez is a 18 y.o. male with;  Stricturing ileocolonic Crohn's disease diagnosed 10/2017 status post ileocecectomy 12/2021 complicated by abdominal wall abscess, intra-abdominal fluid collections, bowel obstructions with diverting loop ileostomy 05/2022 Weight loss - improving History of severe protein calorie malnutrition with previous need for TPN Anemia/low iron Vitamin D  deficiency Oral ulcers.  Hector Dominguez was diagnosed with Crohn's disease 10/2017 after presenting with symptoms of abdominal pain, diarrhea and weight loss. His distribution of disease has included the stomach, duodenum, ileum and colon. At the time of diagnosis his phenotype was strictly inflammatory and later progressed to stricturing disease.  From a medical management standpoint, he was initially treated with prednisone  taper and Humira optimized from q. 14-day dosing to  Q7-day dosing due to flare activity and low level antibodies. Methotrexate was also added to his regimen. Unfortunately this did not control his disease and he was therefore transitioned to Stelara 11/2020. He reported a symptomatic response but continued to manifest elevated CRP and fecal calprotectin. Stelara was further dose optimized to every 4 weeks. Colonoscopy in summer 2023 demonstrated a relatively normal-appearing colon with a few scattered apathae but evidence of a stricture in the ascending colon/ileocecal region.   Hector Dominguez underwent laparoscopic ileocecectomy 12/2021 complicated by abdominal wall abscess, intra-abdominal fluid collections, bowel obstructions with diverting loop ileostomy 05/2022 for which he has had  multiple hospitalizations, re-operation and required TPN. In 11/2022 he underwent ileostomy takedown. During that same hospitalization he required reoperation for distal obstruction at site of previous anastomosis.   Hector Dominguez was rehospitalized 12/2022 for abdominal pain. CTAP showed partial small bowel obstruction with a transition point in a small bowel-small bowel anastomosis. 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 His wound was managed by pediatric surgery. Due to ongoing symptoms of pain and inflammation he was initiated on a steroid taper to which he responded. In 01/2023 Stelara was discontinued and he was started on Rinvoq  45 mg po daily.    Hector Dominguez is now on maintenance Rinvoq  30 mg po daily.  He reports overall feeling improved.  Notes that he will develop abdominal pain if he misses a day of Jak inhibitor therapy.  Bowel movements are loose but only once a day.  Discussed that this could represent active Crohn's, SIBO or bile acid diarrhea.  He previously gained weight over the school year but is now losing weight again.  This could be related to increased activity level during his recent trip to [place] as well as less regimented eating schedule while out of school for the summer.  He reports recent oral ulcers that could be related to food products but also possibly due to Crohn's disease.  He has had 2 fecal calprotectin assessments performed at [facility] in the recent past -05/2023 2320 and 07/2023 1660.  At today's visit we discussed that while it is encouraging that he is generally doing better his elevated fecal calprotectin levels raise concern for some degree of active ongoing inflammation.  As such, I have recommended an updated MRE and colonoscopy performed this summer/fall.  Plan  Continue Rinvoq  30 mg orally daily Immune suppressant monitoring laboratory studies every 4 months: CBC, CMP, ESR, CRP, periodic lipid panel Nutritional labs ordered today: Iron panel, vitamin D , vitamin B12,  folate Advised starting iron supplement for low iron in the form of ferrous sulfate 325 mg or 1 tablet of Vitron-C daily Advise starting vitamin D  supplement vitamin D  1000 to 2000 international units daily. Prescription provided for prednisone  for management of oral ulcers-10 mg tablets-he can take 40 mg p.o. daily when oral ulcers recur. 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  IBD Health Maintenance  Vaccinations Influenza: PCV13: PPSV23: COVID19: HAV/HBV: Shingles: HPV:  DEXA PRN  Eye Exam PRN  Skin Exam Advise annual skin exam on IS  Surveillance Colonoscopy Due 2027-2029  Tobacco Use None  Depression Screen    Planned Follow Up 4-6 months  The patient or caregiver verbalized understanding of the material covered, with no barriers to understanding. All questions were answered. Patient or caregiver is agreeable with the plan outlined above.    It  was a pleasure to see Hector Dominguez.  If you have any questions or concerns regarding this evaluation, do not hesitate to contact me.  Inocente Hausen, MD [facility]

## 2023-12-03 ENCOUNTER — Ambulatory Visit (INDEPENDENT_AMBULATORY_CARE_PROVIDER_SITE_OTHER): Admitting: Pediatrics

## 2023-12-03 ENCOUNTER — Other Ambulatory Visit (INDEPENDENT_AMBULATORY_CARE_PROVIDER_SITE_OTHER)

## 2023-12-03 ENCOUNTER — Encounter: Payer: Self-pay | Admitting: Pediatrics

## 2023-12-03 VITALS — BP 90/56 | HR 109 | Ht 70.0 in | Wt 124.0 lb

## 2023-12-03 DIAGNOSIS — K50919 Crohn's disease, unspecified, with unspecified complications: Secondary | ICD-10-CM | POA: Diagnosis not present

## 2023-12-03 DIAGNOSIS — K50812 Crohn's disease of both small and large intestine with intestinal obstruction: Secondary | ICD-10-CM

## 2023-12-03 DIAGNOSIS — Z79620 Long term (current) use of immunosuppressive biologic: Secondary | ICD-10-CM

## 2023-12-03 DIAGNOSIS — R634 Abnormal weight loss: Secondary | ICD-10-CM

## 2023-12-03 DIAGNOSIS — K121 Other forms of stomatitis: Secondary | ICD-10-CM

## 2023-12-03 DIAGNOSIS — Z7960 Long term (current) use of unspecified immunomodulators and immunosuppressants: Secondary | ICD-10-CM

## 2023-12-03 DIAGNOSIS — E559 Vitamin D deficiency, unspecified: Secondary | ICD-10-CM

## 2023-12-03 DIAGNOSIS — E611 Iron deficiency: Secondary | ICD-10-CM | POA: Diagnosis not present

## 2023-12-03 DIAGNOSIS — Z5181 Encounter for therapeutic drug level monitoring: Secondary | ICD-10-CM

## 2023-12-03 LAB — COMPREHENSIVE METABOLIC PANEL WITH GFR
ALT: 9 U/L (ref 0–53)
AST: 10 U/L (ref 0–37)
Albumin: 4.4 g/dL (ref 3.5–5.2)
Alkaline Phosphatase: 75 U/L (ref 39–117)
BUN: 14 mg/dL (ref 6–23)
CO2: 32 meq/L (ref 19–32)
Calcium: 9.2 mg/dL (ref 8.4–10.5)
Chloride: 106 meq/L (ref 96–112)
Creatinine, Ser: 0.97 mg/dL (ref 0.40–1.50)
GFR: 112.69 mL/min (ref >60.00)
Glucose, Bld: 75 mg/dL (ref 70–99)
Potassium: 4.5 meq/L (ref 3.5–5.1)
Sodium: 143 meq/L (ref 135–145)
Total Bilirubin: 0.5 mg/dL (ref 0.2–1.2)
Total Protein: 7.3 g/dL (ref 6.0–8.3)

## 2023-12-03 LAB — LIPID PANEL
Cholesterol: 118 mg/dL (ref <200)
HDL: 51 mg/dL (ref >=40)
LDL Cholesterol (Calc): 50 mg/dL
Non-HDL Cholesterol (Calc): 67 mg/dL (ref <130)
Total CHOL/HDL Ratio: 2.3 (calc) (ref <5.0)
Triglycerides: 87 mg/dL (ref <150)

## 2023-12-03 LAB — SEDIMENTATION RATE: Sed Rate: 14 mm/h (ref 0–15)

## 2023-12-03 LAB — B12 AND FOLATE PANEL
Folate: 6.7 ng/mL (ref >5.9)
Vitamin B-12: 575 pg/mL (ref 211–911)

## 2023-12-03 LAB — IBC + FERRITIN
Ferritin: 15.9 ng/mL — ABNORMAL LOW (ref 22.0–322.0)
Iron: 31 ug/dL — ABNORMAL LOW (ref 42–165)
Saturation Ratios: 9.3 % — ABNORMAL LOW (ref 20.0–50.0)
TIBC: 333.2 ug/dL (ref 250.0–450.0)
Transferrin: 238 mg/dL (ref 212.0–360.0)

## 2023-12-03 LAB — C-REACTIVE PROTEIN: CRP: 2.3 mg/dL (ref 0.5–20.0)

## 2023-12-03 LAB — VITAMIN D 25 HYDROXY (VIT D DEFICIENCY, FRACTURES): VITD: 22.45 ng/mL — ABNORMAL LOW (ref 30.00–100.00)

## 2023-12-03 MED ORDER — PREDNISONE 10 MG PO TABS: 40.0000 mg | ORAL_TABLET | Freq: Every day | ORAL | 0 refills | Status: DC

## 2023-12-03 NOTE — Patient Instructions (Addendum)
 Your provider has requested that you go to the basement level for lab work before leaving today. Press B on the elevator. The lab is located at the first door on the left as you exit the elevator.  Due to recent changes in healthcare laws, you may see the results of your imaging and laboratory studies on MyChart before your provider has had a chance to review them.  We understand that in some cases there may be results that are confusing or concerning to you. Not all laboratory results come back in the same time frame and the provider may be waiting for multiple results in order to interpret others.  Please give us  48 hours in order for your provider to thoroughly review all the results before contacting the office for clarification of your results.    You have been scheduled for an MR Enterography at [facility] on 12/10/23. Your appointment time is 10:00 am. Please arrive to admitting (at main entrance of the hospital) 1 hour prior to your appointment time for registration purposes. Please make certain not to have anything to eat or drink 6 hours prior to your test. In addition, if you have any metal in your body, have a pacemaker or defibrillator, please be sure to let your ordering physician know. This test typically takes 45 minutes to 1 hour to complete. Should you need to reschedule, please call [phone] to do so.  We have sent the following medications to your pharmacy for you to pick up at your convenience: Prednisone  10 mg, take 40 mg daily for 7 days as needed for mouth ulcers.  You have been scheduled for a colonoscopy. Please follow written instructions given to you at your visit today.   If you use inhalers (even only as needed), please bring them with you on the day of your procedure.  DO NOT TAKE 7 DAYS PRIOR TO TEST- Trulicity (dulaglutide) Ozempic, Wegovy (semaglutide) Mounjaro (tirzepatide) Bydureon Bcise (exanatide extended release)  DO NOT TAKE 1 DAY PRIOR TO  YOUR TEST Rybelsus (semaglutide) Adlyxin (lixisenatide) Victoza (liraglutide) Byetta (exanatide) ___________________________________________________________________________   Follow up in 4-6 months.  Thank you for entrusting me with your care and for choosing [facility], Dr. Inocente Hausen  _______________________________________________________  If your blood pressure at your visit was 140/90 or greater, please contact your primary care physician to follow up on this.  _______________________________________________________  If you are age 23 or older, your body mass index should be between 23-30. Your Body mass index is 17.79 kg/m. If this is out of the aforementioned range listed, please consider follow up with your Primary Care Provider.  If you are age 37 or younger, your body mass index should be between 19-25. Your Body mass index is 17.79 kg/m. If this is out of the aformentioned range listed, please consider follow up with your Primary Care Provider.   ________________________________________________________  The [facility] providers would like to encourage you to use MYCHART to communicate with providers for non-urgent requests or questions.  Due to long hold times on the telephone, sending your provider a message by [facility] may be a faster and more efficient way to get a response.  Please allow 48 business hours for a response.  Please remember that this is for non-urgent requests.  _______________________________________________________  [facility] is using a team-based approach to care.  Your team is made up of your doctor and two to three APPS. Our APPS (Nurse Practitioners and Physician Assistants) work with your physician to  ensure care continuity for you. They are fully qualified to address your health concerns and develop a treatment plan. They communicate directly with your gastroenterologist to care for you. Seeing the Advanced Practice  Practitioners on your physician's team can help you by facilitating care more promptly, often allowing for earlier appointments, access to diagnostic testing, procedures, and other specialty referrals.

## 2023-12-04 ENCOUNTER — Ambulatory Visit: Payer: Self-pay | Admitting: Pediatrics

## 2023-12-04 ENCOUNTER — Encounter: Payer: Self-pay | Admitting: Pediatrics

## 2023-12-04 ENCOUNTER — Other Ambulatory Visit (HOSPITAL_COMMUNITY): Payer: Self-pay

## 2023-12-04 LAB — CBC WITH DIFFERENTIAL/PLATELET
Basophils Absolute: 0 K/uL (ref 0.0–0.1)
Basophils Relative: 0.7 % (ref 0.0–3.0)
Eosinophils Absolute: 0.1 K/uL (ref 0.0–0.7)
Eosinophils Relative: 1.4 % (ref 0.0–5.0)
HCT: 41.4 % (ref 39.0–52.0)
Hemoglobin: 13.8 g/dL (ref 13.0–17.0)
Lymphocytes Relative: 11.1 % — ABNORMAL LOW (ref 12.0–46.0)
Lymphs Abs: 0.7 K/uL (ref 0.7–4.0)
MCHC: 33.3 g/dL (ref 30.0–36.0)
MCV: 79.5 fl (ref 78.0–100.0)
Monocytes Absolute: 0.4 K/uL (ref 0.1–1.0)
Monocytes Relative: 6.1 % (ref 3.0–12.0)
Neutro Abs: 5.3 K/uL (ref 1.4–7.7)
Neutrophils Relative %: 80.7 % — ABNORMAL HIGH (ref 43.0–77.0)
Platelets: 344 K/uL (ref 150.0–400.0)
RBC: 5.21 Mil/uL (ref 4.22–5.81)
RDW: 14.9 % — ABNORMAL HIGH (ref 11.5–14.6)
WBC: 6.6 K/uL (ref 4.5–10.5)

## 2023-12-04 NOTE — Telephone Encounter (Signed)
 Noted, thanks!

## 2023-12-04 NOTE — Telephone Encounter (Signed)
 Pharmacy Patient Advocate Encounter  Received notification from [employer] that Prior Authorization for Rinvoq  30MG  er tablets has been APPROVED from 12-03-2023 to 12-02-2024

## 2023-12-07 ENCOUNTER — Telehealth: Payer: Self-pay

## 2023-12-07 MED ORDER — NA SULFATE-K SULFATE-MG SULF 17.5-3.13-1.6 GM/177ML PO SOLN: 1.0000 | Freq: Once | ORAL | 0 refills | Status: AC

## 2023-12-07 NOTE — Telephone Encounter (Signed)
 I called Hector Dominguez and I apologized that his Suprep was not available.  I advised him that I resent the prescription to his pharmacy and if he has any additional issues he can call me back.

## 2023-12-10 ENCOUNTER — Ambulatory Visit (HOSPITAL_COMMUNITY)
Admission: RE | Admit: 2023-12-10 | Discharge: 2023-12-10 | Disposition: A | Discharge status: Home / Self Care | Source: Ambulatory Visit | Attending: Pediatrics | Admitting: Pediatrics

## 2023-12-10 DIAGNOSIS — K50919 Crohn's disease, unspecified, with unspecified complications: Secondary | ICD-10-CM

## 2023-12-10 DIAGNOSIS — K5090 Crohn's disease, unspecified, without complications: Secondary | ICD-10-CM | POA: Diagnosis not present

## 2023-12-10 MED ORDER — GADOBUTROL 1 MMOL/ML IV SOLN
5.0000 mL | Freq: Once | INTRAVENOUS | Status: AC | PRN
  Administered 2023-12-10: 5 mL via INTRAVENOUS

## 2023-12-28 NOTE — Progress Notes (Unsigned)
 [facility] History and Physical   Primary Care Physician:  Brand Tanda PARAS, MD (Inactive)   Reason for Procedure:  History of Crohn's disease  Plan:    Colonoscopy     HPI: Hector Dominguez is a 18 y.o. male undergoing restaging colonoscopy for a history of Crohn's disease.  Patient has a history of stricturing ileocolonic Crohn's disease diagnosed in 2019 status post ileocecectomy 2023 complicated by abdominal wall abscess, intra-abdominal fluid collections, bowel obstructions with diverting loop ileostomy and subsequent takedown.  Postoperatively he has been maintained on Rinvoq  30 mg orally daily.  Reports symptomatic remission at the time of this exam.  Noted that he had 2 elevated fecal calprotectin assessments 05/2023 - 2030 and 07/2023 -1660.  Recent MRI E did not show definitive evidence of active Crohn's disease.   Past Medical History:  Diagnosis Date   Anemia    Crohn's colitis (HCC)    Eczema    GERD (gastroesophageal reflux disease)    Protein calorie malnutrition (HCC)    Vitamin D  deficiency     Past Surgical History:  Procedure Laterality Date   BOWEL RESECTION     multiple; last July 2024   WISDOM TOOTH EXTRACTION      Prior to Admission medications   Medication Sig Start Date End Date Taking? Authorizing Provider  dexamethasone  0.5 MG/5ML elixir Swish and spit 5 ml three times a day for 7 days, keep solution x 5 min each time and don't eat/drink x 30 min thereafter Patient taking differently: Take 5 mLs by mouth as needed. Swish and spit 5 ml three times a day for 7 days, keep solution x 5 min each time and don't eat/drink x 30 min thereafter 10/23/23   Charlanne Groom, MD  Multiple Vitamins-Minerals (MULTI-VITAMIN GUMMIES PO) Take 2 each by mouth daily.    [provider]  predniSONE  (DELTASONE ) 10 MG tablet Take 4 tablets (40 mg total) by mouth daily with breakfast. 12/03/23   Timia Casselman, Hector HERO, MD  RINVOQ  30 MG TB24 Take 1 tablet (30 mg  total) by mouth daily. 08/17/23   Suzann Hector HERO, MD    Current Outpatient Medications  Medication Sig Dispense Refill   RINVOQ  30 MG TB24 Take 1 tablet (30 mg total) by mouth daily. 90 tablet 3   triamcinolone cream (KENALOG) 0.1 % Apply 1 Application topically.     Multiple Vitamins-Minerals (MULTI-VITAMIN GUMMIES PO) Take 2 each by mouth daily.     predniSONE  (DELTASONE ) 10 MG tablet Take 4 tablets (40 mg total) by mouth daily with breakfast. (Patient not taking: Reported on 12/29/2023) 100 tablet 0   Current Facility-Administered Medications  Medication Dose Route Frequency Provider Last Rate Last Admin   0.9 %  sodium chloride  infusion  500 mL Intravenous Once Ringo Sherod M, MD        Allergies as of 12/29/2023 - Review Complete 12/29/2023  Allergen Reaction Noted   Shrimp (diagnostic) Nausea Only 12/29/2023    Family History  Problem Relation Age of Onset   Colon cancer Neg Hx    Esophageal cancer Neg Hx    Stomach cancer Neg Hx    Rectal cancer Neg Hx     Social History   Socioeconomic History   Marital status: Single    Spouse name: Not on file   Number of children: Not on file   Years of education: Not on file   Highest education level: Not on file  Occupational History   Not on file  Tobacco Use   Smoking status: Never   Smokeless tobacco: Never  Vaping Use   Vaping status: Never Used  Substance and Sexual Activity   Alcohol use: Never   Drug use: Never   Sexual activity: Not on file  Other Topics Concern   Not on file  Social History Narrative   Leiland attends [employer] majoring in Furniture conservator/restorer of college was delayed due to [employer] related surgeries   Family resides in [place]   Social Drivers of Health   Financial Resource Strain: Not on file  Food Insecurity: No Food Insecurity (02/27/2022)   Received from [facility]   Hunger Vital Sign    Within the past 12 months, you worried that your food would run out  before you got the money to buy more.: Never true    Within the past 12 months, the food you bought just didn't last and you didn't have money to get more.: Never true  Transportation Needs: No Transportation Needs (05/14/2022)   Received from [facility] - Transportation    In the past 12 months, has lack of transportation kept you from medical appointments or from getting medications?: No    Lack of Transportation (Non-Medical): No  Physical Activity: Not on file  Stress: Not on file  Social Connections: Unknown (11/22/2021)   Received from [facility]   Social Network    Social Network: Not on file  Intimate Partner Violence: Unknown (11/22/2021)   Received from [facility]   HITS    Physically Hurt: Not on file    Insult or Talk Down To: Not on file    Threaten Physical Harm: Not on file    Scream or Curse: Not on file    Review of Systems:  All other review of systems negative except as mentioned in the HPI.  Physical Exam: Vital signs BP 118/71   Pulse 72   Temp 97.8 F (36.6 C) (Temporal)   Resp 18   Ht 5' 10 (1.778 m)   Wt 124 lb (56.2 kg)   SpO2 100%   BMI 17.79 kg/m   General:   Alert,  Well-developed, well-nourished, pleasant and cooperative in NAD Airway:  Mallampati 1 Lungs:  Clear throughout to auscultation.   Heart:  Regular rate and rhythm; no murmurs, clicks, rubs,  or gallops. Abdomen:  Soft, nontender and nondistended. Normal bowel sounds.   Neuro/Psych:  Normal mood and affect. A and O x 3  Hector Hausen, MD [facility]

## 2023-12-29 ENCOUNTER — Ambulatory Visit: Admitting: Pediatrics

## 2023-12-29 ENCOUNTER — Encounter: Payer: Self-pay | Admitting: Pediatrics

## 2023-12-29 VITALS — BP 107/67 | HR 70 | Temp 97.8°F | Resp 21 | Ht 70.0 in | Wt 124.0 lb

## 2023-12-29 DIAGNOSIS — K529 Noninfective gastroenteritis and colitis, unspecified: Secondary | ICD-10-CM

## 2023-12-29 DIAGNOSIS — R195 Other fecal abnormalities: Secondary | ICD-10-CM

## 2023-12-29 DIAGNOSIS — K50919 Crohn's disease, unspecified, with unspecified complications: Secondary | ICD-10-CM | POA: Diagnosis not present

## 2023-12-29 DIAGNOSIS — R634 Abnormal weight loss: Secondary | ICD-10-CM | POA: Diagnosis not present

## 2023-12-29 DIAGNOSIS — K635 Polyp of colon: Secondary | ICD-10-CM | POA: Diagnosis not present

## 2023-12-29 DIAGNOSIS — Z980 Intestinal bypass and anastomosis status: Secondary | ICD-10-CM | POA: Diagnosis not present

## 2023-12-29 MED ORDER — SODIUM CHLORIDE 0.9 % IV SOLN: 500.0000 mL | Freq: Once | INTRAVENOUS | Status: DC

## 2023-12-29 MED ORDER — PREDNISONE 10 MG PO TABS: 40.0000 mg | ORAL_TABLET | Freq: Every day | ORAL | 1 refills | Status: DC

## 2023-12-29 NOTE — Patient Instructions (Signed)
 Thank you for letting us  care for your healthcare needs today! Await pathology results.   YOU HAD AN ENDOSCOPIC PROCEDURE TODAY AT THE [facility]:   Refer to the procedure report that was given to you for any specific questions about what was found during the examination.  If the procedure report does not answer your questions, please call your gastroenterologist to clarify.  If you requested that your care partner not be given the details of your procedure findings, then the procedure report has been included in a sealed envelope for you to review at your convenience later.  YOU SHOULD EXPECT: Some feelings of bloating in the abdomen. Passage of more gas than usual.  Walking can help get rid of the air that was put into your GI tract during the procedure and reduce the bloating. If you had a lower endoscopy (such as a colonoscopy or flexible sigmoidoscopy) you may notice spotting of blood in your stool or on the toilet paper. If you underwent a bowel prep for your procedure, you may not have a normal bowel movement for a few days.  Please Note:  You might notice some irritation and congestion in your nose or some drainage.  This is from the oxygen used during your procedure.  There is no need for concern and it should clear up in a day or so.  SYMPTOMS TO REPORT IMMEDIATELY:  Following lower endoscopy (colonoscopy or flexible sigmoidoscopy):  Excessive amounts of blood in the stool  Significant tenderness or worsening of abdominal pains  Swelling of the abdomen that is new, acute  Fever of 100F or higher  For urgent or emergent issues, a gastroenterologist can be reached at any hour by calling [phone]. Do not use MyChart messaging for urgent concerns.    DIET:  We do recommend a small meal at first, but then you may proceed to your regular diet.  Drink plenty of fluids but you should avoid alcoholic beverages for 24 hours.  ACTIVITY:  You should plan to take it easy for  the rest of today and you should NOT DRIVE or use heavy machinery until tomorrow (because of the sedation medicines used during the test).    FOLLOW UP: Our staff will call the number listed on your records the next business day following your procedure.  We will call around 7:15- 8:00 am to check on you and address any questions or concerns that you may have regarding the information given to you following your procedure. If we do not reach you, we will leave a message.     If any biopsies were taken you will be contacted by phone or by letter within the next 1-3 weeks.  Please call us  at [phone] if you have not heard about the biopsies in 3 weeks.    SIGNATURES/CONFIDENTIALITY: You and/or your care partner have signed paperwork which will be entered into your electronic medical record.  These signatures attest to the fact that that the information above on your After Visit Summary has been reviewed and is understood.  Full responsibility of the confidentiality of this discharge information lies with you and/or your care-partner.

## 2023-12-29 NOTE — Progress Notes (Signed)
 Report given to PACU, vss

## 2023-12-29 NOTE — Progress Notes (Signed)
 Called to room to assist during endoscopic procedure.  Patient ID and intended procedure confirmed with present staff. Received instructions for my participation in the procedure from the performing physician.

## 2023-12-29 NOTE — Progress Notes (Signed)
 Pt's states no medical or surgical changes since previsit or office visit.

## 2023-12-29 NOTE — Progress Notes (Signed)
Ephedrine 5 mg given IV due to low BP, MD updated.   ?

## 2023-12-29 NOTE — Op Note (Addendum)
 [facility] Patient Name: Hector Dominguez Procedure Date: 12/29/2023 2:34 PM MRN: [id number] Endoscopist: Inocente Hausen , MD, [id number] Age: 18 Referring MD:  Date of Birth: 2005/08/07 Gender: Male Account #: [id number] Procedure:                Colonoscopy Indications:              Last colonoscopy: 2023, Disease activity assessment                            of Crohn's disease of the small bowel and colon,                            Assess therapeutic response to therapy of Crohn's                            disease of the small bowel and colon; Stricturing                            ileocolonic Crohn's disease diagnosed 10/2017 status                            post right hemicolectomy 12/2021 complicated by                            abdominal wall abscess, intra-abdominal fluid                            collections, bowel obstructions with diverting loop                            ileostomy 05/2022 and takedown. Patient is currently                            on Rinvoq  30 mg orally daily and has had elevated                            fecal calprotectin assessments 1660, 2320 as well                            as weight loss. Medicines:                Monitored Anesthesia Care Procedure:                Pre-Anesthesia Assessment:                           - Prior to the procedure, a History and Physical                            was performed, and patient medications and                            allergies were reviewed. The patient's tolerance of  previous anesthesia was also reviewed. The risks                            and benefits of the procedure and the sedation                            options and risks were discussed with the patient.                            All questions were answered, and informed consent                            was obtained. Prior Anticoagulants: The patient has                            taken no  anticoagulant or antiplatelet agents. ASA                            Grade Assessment: II - A patient with mild systemic                            disease. After reviewing the risks and benefits,                            the patient was deemed in satisfactory condition to                            undergo the procedure.                           After obtaining informed consent, the colonoscope                            was passed under direct vision. Throughout the                            procedure, the patient's blood pressure, pulse, and                            oxygen saturations were monitored continuously. The                            CF [id number] #[id number] was introduced through the anus                            and advanced to the terminal ileum. The colonoscopy                            was performed without difficulty. The patient                            tolerated the procedure well. The quality of the  bowel preparation was good. The terminal ileum,                            ileocecal valve, appendiceal orifice, and rectum                            were photographed. Scope In: 2:42:56 PM Scope Out: 3:02:14 PM Scope Withdrawal Time: 0 hours 14 minutes 3 seconds  Total Procedure Duration: 0 hours 19 minutes 18 seconds  Findings:                 The perianal and digital rectal examinations were                            normal. Pertinent negatives include normal                            sphincter tone and no palpable rectal lesions.                           There was evidence of a prior ileo-colonic                            anastomosis in the transverse colon. This was                            patent. The anastomosis was traversed.                           Scattered, patchy moderate to severe inflammation                            characterized by congestion (edema), erosions,                            erythema, friability,  linear erosions, loss of                            vascularity, mucus and serpentine ulcerations was                            found in the entire colon. Biopsies were taken with                            a cold forceps for histology.                           Scattered moderate inflammation characterized by                            congestion (edema), erosions, erythema, friability,                            granularity and loss of vascularity was found in  the neo-terminal Ileum. Rutgeerts i2. Biopsies were                            taken with a cold forceps for histology.                           Retroflexion was deferred in the rectum due to                            inflammation. Complications:            No immediate complications. Estimated blood loss:                            Minimal. Estimated Blood Loss:     Estimated blood loss was minimal. Impression:               - Patent end-to-side ileo-colonic anastomosis.                           - Scattered moderate to severe inflammation was                            found in the entire examined colon. Biopsied. R/O                            CMV.                           - Moderate inflammation was found in the ileum.                            Rutgeerts i2. Biopsied. R/O CMV.                           - Crohn's disease unresponsive to Jak inhibitor                            therapy. Previously treated with Humira,                            methotrexate, Stelara without response. Recommendation:           - Discharge patient to home (ambulatory).                           - Await pathology results. Rule out CMV on                            histopathology.                           - Pending pathology results will consider alternate                            treatment options for patient -infliximab, Entyvio,  Tremfya.                           - The findings and  recommendations were discussed                            with the patient's family.                           - Patient has a contact number available for                            emergencies. The signs and symptoms of potential                            delayed complications were discussed with the                            patient. Return to normal activities tomorrow.                            Written discharge instructions were provided to the                            patient. Inocente Hausen, MD 12/29/2023 3:18:31 PM This report has been signed electronically.

## 2023-12-30 ENCOUNTER — Telehealth: Payer: Self-pay

## 2023-12-30 NOTE — Telephone Encounter (Signed)
  Follow up Call-     12/29/2023    1:47 PM  Call back number  Post procedure Call Back phone  # [phone]  Permission to leave phone message Yes     Patient questions:  Do you have a fever, pain , or abdominal swelling? No. Pain Score  0 *  Have you tolerated food without any problems? Yes.    Have you been able to return to your normal activities? Yes.    Do you have any questions about your discharge instructions: Diet   No. Medications  No. Follow up visit  No.  Do you have questions or concerns about your Care? No.  Actions: * If pain score is 4 or above: No action needed, pain <4.

## 2024-01-01 LAB — SURGICAL PATHOLOGY

## 2024-01-05 ENCOUNTER — Telehealth: Payer: Self-pay | Admitting: Pediatrics

## 2024-01-05 NOTE — Telephone Encounter (Signed)
 Patient father called and stated that he is needing to know what exactly is going on with his son. Patient father stated that he is teaching tomorrow but if we can leave a detailed VM he would greatly appreciate it. Please advise.

## 2024-01-06 NOTE — Telephone Encounter (Signed)
 Called and left patient's father Cathy) a detailed vm letting him know that Dr. Suzann has not reviewed pathology results yet. Advised that Dr. Suzann is out of the office this week and covering the hospital next week. Advised that we will contact them once results have been reviewed. Myles Ned to call back if they have other questions or concerns.

## 2024-01-06 NOTE — Telephone Encounter (Signed)
 Patient's father returned call. Hector Dominguez has been advised that Dr. Suzann will be advising on treatment options once she reviews pathology results. Hector Dominguez is aware that Dr. Suzann is out of the office this week and will be covering the hospital next week but we will be in touch with her recommendations. Hector Dominguez requested to be notified of results and recommendations because patient is in college and does not always check his email as he should. I told Hector Dominguez that I would make Dr. Suzann aware.

## 2024-01-12 ENCOUNTER — Ambulatory Visit: Payer: Self-pay | Admitting: Pediatrics

## 2024-01-13 ENCOUNTER — Encounter: Payer: Self-pay | Admitting: Pediatrics

## 2024-01-13 DIAGNOSIS — K50919 Crohn's disease, unspecified, with unspecified complications: Secondary | ICD-10-CM

## 2024-01-25 ENCOUNTER — Encounter: Payer: Self-pay | Admitting: Pediatrics

## 2024-01-25 ENCOUNTER — Other Ambulatory Visit (HOSPITAL_COMMUNITY): Payer: Self-pay

## 2024-01-25 MED ORDER — PREDNISONE 10 MG PO TABS: 40.0000 mg | ORAL_TABLET | Freq: Every day | ORAL | 1 refills | Status: AC

## 2024-01-25 MED ORDER — TREMFYA PEN 200 MG/2ML ~~LOC~~ SOAJ
2.0000 | SUBCUTANEOUS | Status: DC
Start: 2024-01-25 — End: 2024-02-01

## 2024-01-25 MED ORDER — TREMFYA PEN 200 MG/2ML ~~LOC~~ SOAJ: 1.0000 | SUBCUTANEOUS | Status: DC

## 2024-01-26 ENCOUNTER — Other Ambulatory Visit (HOSPITAL_COMMUNITY): Payer: Self-pay

## 2024-01-26 ENCOUNTER — Telehealth: Payer: Self-pay

## 2024-01-26 NOTE — Telephone Encounter (Signed)
 I will finish up the request

## 2024-01-26 NOTE — Telephone Encounter (Signed)
 Pharmacy Patient Advocate Encounter   Received notification from Patient Advice Request messages that prior authorization for Tremfya  200MG /2ML syringes is required/requested.   Insurance verification completed.   The patient is insured through [facility] .   Prior Authorization form/request asks a question that requires your assistance. Please see the question below and advise accordingly. The PA will not be submitted until the necessary information is received.

## 2024-01-28 NOTE — Telephone Encounter (Signed)
 Pharmacy Patient Advocate Encounter  Received notification from [facility] that Prior Authorization for Tremfya  200MG /2ML syringes has been CANCELLED due to additional information required.  A clearly defined clinical reason is required to justify why the patient cannot take Skyrizi. Mechanism of action or medication preference is not accepted.   PA #/Case ID/Reference #: [id number]

## 2024-01-29 ENCOUNTER — Encounter: Payer: Self-pay | Admitting: Pediatrics

## 2024-01-29 ENCOUNTER — Telehealth: Payer: Self-pay | Admitting: Gastroenterology

## 2024-01-29 ENCOUNTER — Telehealth: Payer: Self-pay | Admitting: Pediatrics

## 2024-01-29 NOTE — Telephone Encounter (Signed)
 Spoke with patients father who has concerns and questions about Skyrizi.  Patient had requested Tremfya  unfortunately not covered by insurance therefore they recommend Skyrizi.  Did inform patient's father this is a [place] 41.  His father would like to go ahead and proceed with the Skyrizi and get the medication started as soon as possible.  Have alerted the nurses of above.

## 2024-01-29 NOTE — Telephone Encounter (Signed)
 Inbound call from patients father wanting to speak to nurse in regards to skyrizi recommendation from Dr.McGreal.  Please advise  Thank you

## 2024-02-01 ENCOUNTER — Telehealth: Payer: Self-pay

## 2024-02-01 DIAGNOSIS — K50919 Crohn's disease, unspecified, with unspecified complications: Secondary | ICD-10-CM

## 2024-02-01 MED ORDER — SKYRIZI 360 MG/2.4ML ~~LOC~~ SOCT
SUBCUTANEOUS | Status: DC
Start: 2024-02-01 — End: 2024-02-04

## 2024-02-01 NOTE — Telephone Encounter (Signed)
 Skyrizi  order will be:   Skyrizi  induction infusions 600 mg IV every 4 weeks at:  week 0, week 4 and week 8   Skyrizi  maintenance injections 360 mg subcutaneously every 8 weeks beginning at week 12 and every 8 weeks thereafter   Labs at each infusion: CBC, CMP, ESR, CRP

## 2024-02-01 NOTE — Telephone Encounter (Signed)
 Orders documented in separate TE, see 9/22 TE.

## 2024-02-01 NOTE — Telephone Encounter (Signed)
 Called [facility] at [facility] [address] [place], [place] [address]. I spoke with Clayborne, she stated that patient's referral should be entered in their system by Thursday this week. After referral is in system they will work on PA and schedule patient once approval is obtained. Timing of appointment will be based on insurance turn around time.

## 2024-02-01 NOTE — Telephone Encounter (Addendum)
 Skyrizi  infusion order form faxed to [facility] along with patient's demographics, insurance information, and records. Patient is overdue for TB test but we have placed orders for this to be collected. I have notified patient to complete labs at his earliest convenience as insurance may require updated lab results (see 01/29/24 TE).  Spoke with Clayborne at [facility] location - they will ask patient which location the patient prefers as well.    Our pharmacy team will submit PA for Skyrizi  maintenance dose:  Skyrizi  360 mg subcutaneously every 8 weeks.

## 2024-02-03 ENCOUNTER — Other Ambulatory Visit (HOSPITAL_COMMUNITY): Payer: Self-pay

## 2024-02-03 ENCOUNTER — Telehealth: Payer: Self-pay

## 2024-02-03 NOTE — Telephone Encounter (Signed)
 Pharmacy Patient Advocate Encounter   Received notification from Physician's Office that prior authorization for Skyrizi  360MG /2.4ML (150MG /ML) single-dose prefilled cartridge with on-body injector is required/requested.   Insurance verification completed.   The patient is insured through [facility] .   Per test claim: PA required; PA submitted to above mentioned insurance via Latent Key/confirmation #/EOC [facility] Status is pending

## 2024-02-04 ENCOUNTER — Other Ambulatory Visit (HOSPITAL_COMMUNITY): Payer: Self-pay

## 2024-02-04 MED ORDER — SKYRIZI 360 MG/2.4ML ~~LOC~~ SOCT: SUBCUTANEOUS | 5 refills | Status: AC

## 2024-02-04 NOTE — Telephone Encounter (Signed)
 Per pharmacy team:  Pharmacy Patient Advocate Encounter   Received notification from [facility] that Prior Authorization for Skyrizi  360MG /2.4ML (150MG /ML) single-dose prefilled cartridge with on-body injector has been APPROVED from 02-04-2024 to 02-02-2025    PA #/Case ID/Reference #: [id number]   Must fill at outside Specialty Pharmacy. Insurance will not allow fill at [facility]

## 2024-02-04 NOTE — Telephone Encounter (Signed)
 Pharmacy Patient Advocate Encounter  Received notification from [facility] that Prior Authorization for Skyrizi  360MG /2.4ML (150MG /ML) single-dose prefilled cartridge with on-body injector has been APPROVED from 02-04-2024 to 02-02-2025   PA #/Case ID/Reference #: [id number]  Must fill at outside Specialty Pharmacy. Insurance will not allow fill at [facility]

## 2024-02-04 NOTE — Telephone Encounter (Addendum)
 Skyrizi  maintenance [facility]. [facility] will contact patient to schedule infusions. Will obtain those records.

## 2024-02-04 NOTE — Telephone Encounter (Signed)
 Noted, see 02/01/24 TE

## 2024-02-11 DIAGNOSIS — Z79899 Other long term (current) drug therapy: Secondary | ICD-10-CM | POA: Diagnosis not present

## 2024-02-11 DIAGNOSIS — K50919 Crohn's disease, unspecified, with unspecified complications: Secondary | ICD-10-CM | POA: Diagnosis not present

## 2024-02-12 NOTE — Telephone Encounter (Signed)
 E-mail sent to [facility] health liaison, Macario, to follow up on PA status for Skyrizi  inductions infusions. Will await response.

## 2024-02-16 NOTE — Telephone Encounter (Signed)
 Noted

## 2024-02-16 NOTE — Telephone Encounter (Signed)
 Macario emailed me to let me know patient was seen on 10/2 and next appt is 10/30.

## 2024-03-11 LAB — LAB REPORT - SCANNED: EGFR: 119

## 2024-03-16 ENCOUNTER — Encounter: Payer: Self-pay | Admitting: Pediatrics

## 2024-04-06 DIAGNOSIS — K50919 Crohn's disease, unspecified, with unspecified complications: Secondary | ICD-10-CM | POA: Diagnosis not present

## 2024-04-06 DIAGNOSIS — Z79899 Other long term (current) drug therapy: Secondary | ICD-10-CM | POA: Diagnosis not present

## 2024-04-06 LAB — LAB REPORT - SCANNED: EGFR: 119

## 2024-04-13 ENCOUNTER — Encounter: Payer: Self-pay | Admitting: Pediatrics

## 2024-04-13 NOTE — Telephone Encounter (Signed)
 Called and spoke with patient's mother Hector Dominguez. Reviewed information as noted above. Hector Dominguez states that she will call Uthman and have him reach out via MyChart to answer Dr. Andy questions. Hector Dominguez will also have patient call the office to schedule a follow up with Dr. Suzann because she does not know his schedule.

## 2024-05-25 ENCOUNTER — Telehealth: Payer: Self-pay | Admitting: Pediatrics

## 2024-05-25 ENCOUNTER — Other Ambulatory Visit (HOSPITAL_COMMUNITY): Payer: Self-pay

## 2024-05-25 NOTE — Telephone Encounter (Signed)
 Hector Dominguez with [facility] is calling to have an authorization for Skyrizi . Provided CoverMyMeds code [facility]

## 2024-05-25 NOTE — Telephone Encounter (Signed)
 I do not see where patient has started Skyrizi  inductions with the infusion center. Can you please confirm if patient has done so? If not, he needs to do his inductions before starting his at home injections.

## 2024-05-26 ENCOUNTER — Telehealth: Payer: Self-pay

## 2024-05-26 NOTE — Telephone Encounter (Signed)
 PA request has been Started. New Encounter has been or will be created for follow up. For additional info see Pharmacy Prior Auth telephone encounter from 05-26-2024.

## 2024-05-26 NOTE — Telephone Encounter (Signed)
 Pharmacy Patient Advocate Encounter   Received notification from Pt Calls Messages that prior authorization for Skyrizi  360MG /2.4ML (150MG /ML) single-dose prefilled cartridge with on-body injector is required/requested.   Insurance verification completed.   The patient is insured through [facility].   Prior Authorization form/request asks a question that requires your assistance. Please see the question below and advise accordingly. The PA will not be submitted until the necessary information is received.   Insurance requires an updated TB test to be performed for submission. Noted that one was ordered in September but this was never done.

## 2024-05-26 NOTE — Telephone Encounter (Signed)
 Unable to leave message, line continues to ring w/o VM option.

## 2024-05-27 ENCOUNTER — Telehealth: Payer: Self-pay | Admitting: Pediatrics

## 2024-05-27 NOTE — Telephone Encounter (Signed)
 Incoming call from pharmacy requesting prior authorization for Risankizumab -rzaa (SKYRIZI ). Key: [id number] Good call back number: [phone] Hector Dominguez Please advise. Thank you.

## 2024-05-27 NOTE — Telephone Encounter (Signed)
 Unable to leave message, line continues to ring w/o VM option.

## 2024-05-30 NOTE — Telephone Encounter (Signed)
 After multiple attempts to reach the pt, pt father was contacted and notified of the request for an updated TB so that the PA can be submitted for the pt skyrizi . Discussed this with pt father and my chart message was sent to pt.  Pt father verbalized understanding with all questions answered.

## 2024-05-30 NOTE — Telephone Encounter (Signed)
 Left message for pt to call back

## 2024-05-30 NOTE — Telephone Encounter (Signed)
 Insurance requires updated TB test results for submission. Requested in September but was never collected/processed. Noted that office has tried to contact patient twice with no answer and unable to leave a voicemail.
